# Patient Record
Sex: Female | Born: 1950 | Race: White | Hispanic: No | Marital: Married | Smoking: Former smoker
Health system: Southern US, Community
[De-identification: ages and names within clinical notes are randomized; demographics above are authoritative.]

## PROBLEM LIST (undated history)

## (undated) DIAGNOSIS — D242 Benign neoplasm of left breast: Secondary | ICD-10-CM

## (undated) HISTORY — PX: JOINT REPLACEMENT: SHX530

## (undated) HISTORY — PX: ABDOMINAL HYSTERECTOMY: SHX81

## (undated) HISTORY — PX: OVARIAN CYST SURGERY: SHX726

---

## 1999-11-13 ENCOUNTER — Encounter: Payer: Self-pay | Admitting: Orthopedic Surgery

## 1999-11-17 ENCOUNTER — Encounter: Payer: Self-pay | Admitting: Orthopedic Surgery

## 1999-11-17 ENCOUNTER — Inpatient Hospital Stay (HOSPITAL_COMMUNITY): Admission: RE | Admit: 1999-11-17 | Discharge: 1999-11-21 | Payer: Self-pay | Admitting: Orthopedic Surgery

## 2019-10-18 ENCOUNTER — Other Ambulatory Visit: Payer: Self-pay | Admitting: Family Medicine

## 2019-10-18 DIAGNOSIS — N6489 Other specified disorders of breast: Secondary | ICD-10-CM

## 2019-10-25 ENCOUNTER — Other Ambulatory Visit: Payer: Self-pay

## 2019-10-25 ENCOUNTER — Ambulatory Visit
Admission: RE | Admit: 2019-10-25 | Discharge: 2019-10-25 | Disposition: A | Discharge status: Home / Self Care | Payer: Medicare Other | Source: Ambulatory Visit | Attending: Family Medicine | Admitting: Family Medicine

## 2019-10-25 DIAGNOSIS — N6489 Other specified disorders of breast: Secondary | ICD-10-CM

## 2019-11-14 ENCOUNTER — Ambulatory Visit: Payer: Self-pay | Admitting: Surgery

## 2019-11-14 DIAGNOSIS — D242 Benign neoplasm of left breast: Secondary | ICD-10-CM

## 2019-11-14 NOTE — H&P (Signed)
Terry Arnold Appointment: 11/14/2019 1:50 PM Location: [facility] Patient #: [id number] DOB: 1950-06-10 Married / Language: Terry Arnold / Race: White Female  History of Present Illness Terry Arnold A. Terry Cornacchia MD; 11/14/2019 2:17 PM) Patient words: Patient said for evaluation of abnormal screening mammogram. She underwent a usual Arnold mammogram followed by diagnostic mammogram due to abnormality noted left breast at 12:00 in Terry upper outer quadrant. 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 She presents for discussion of left breast lumpectomy. She has no other complaints. No history of breast pain, nipple discharge or breast mass. no Family history of breast cancer noted.       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 This was found to be concordant by Dr. Dorise Arnold, with excision recommended.  Pathology results were discussed with Terry patient by telephone. Terry patient reported doing well after Terry biopsy with tenderness at Terry site. Post biopsy instructions and care were reviewed and questions were answered. Terry patient was encouraged to call Terry Arnold for any additional concerns.  Surgical consultation has been arranged with Dr. Erroll Arnold at [facility] on November 13, 2019.  Pathology results reported by Terry Acres RN on 10/26/2019.   Electronically Signed By: Terry Arnold On: 10/26/2019 13:52                Diagnosis Breast, left, needle core biopsy, 12 o'clock - SMALL DUCTAL PAPILLOMA. 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 - FIBROCYSTIC CHANGES. - SEE MICROSCOPIC DESCRIPTION. Microscopic Comment Called to Terry Arnold on 10/26/19.  (JDP:gt, 10/26/19) Terry Arnold, Electronic Signature.  Terry patient is a 69 year old female.   Past Surgical History (Terry Arnold, Terry Arnold; 11/14/2019 1:41 PM) Breast Biopsy Left. Hip Arnold Left. Hysterectomy (not due to cancer) - Complete  Diagnostic Studies History (Terry Arnold; 11/14/2019 1:41 PM) Mammogram within last year Pap Smear >5 years ago  Allergies (Terry Arnold, CMA; 11/14/2019 1:42 PM) No Known Drug Allergies [11/14/2019]: Allergies Reconciled  Medication History (Terry Arnold, [place]; 11/14/2019 1:42 PM) No Current Medications Medications Reconciled  Social History Terry Arnold, CMA; 11/14/2019 1:41 PM) Alcohol use Occasional alcohol use. Caffeine use Coffee. Tobacco use Former smoker.  Family History (Terry Arnold, Terry Arnold; 11/14/2019 1:41 PM) Diabetes Mellitus Brother. Respiratory Condition Sister.  Pregnancy / Birth History Terry Arnold, [place]; 11/14/2019 1:41 PM) Age at menarche 11 years. Age of menopause 48-50 Contraceptive History Oral contraceptives. Gravida 0 Para 0  Other Problems (Terry Arnold, [place]; 11/14/2019 1:41 PM) Oophorectomy     Review of Systems (Terry Nolan CMA; 11/14/2019 1:41 PM) Breast Not Present- Breast Mass, Breast Pain, Nipple Discharge and Skin Changes. Cardiovascular Not Present- Chest Pain, Difficulty Breathing Lying Down, Leg Cramps, Palpitations, Rapid Heart Rate, Shortness of Breath and Swelling of Extremities. Gastrointestinal Not Present- Abdominal Pain, Bloating, Bloody Stool, Change in Bowel Habits, Chronic diarrhea, Constipation, Difficulty Swallowing, Excessive gas, Gets full quickly at meals, Hemorrhoids, Indigestion, Nausea, Rectal Pain and Vomiting. Female Genitourinary Not Present- Frequency, Nocturia, Painful Urination, Pelvic Pain and Urgency. Musculoskeletal Not Present- Back Pain, Joint Pain, Joint Stiffness, Muscle Pain, Muscle Weakness and Swelling of Extremities. Neurological  Not Present- Decreased Memory, Fainting, Headaches, Numbness, Seizures, Tingling, Tremor, Trouble walking and Weakness. Psychiatric Not Present- Anxiety, Bipolar, Change in Sleep Pattern, Depression, Fearful and Frequent  crying. Endocrine Not Present- Cold Intolerance, Excessive Hunger, Hair Changes, Heat Intolerance, Hot flashes and New Diabetes. Hematology Not Present- Blood Thinners, Easy Bruising, Excessive bleeding, Gland problems, HIV and Persistent Infections.  Vitals (Terry Nolan CMA; 11/14/2019 1:42 PM) 11/14/2019 1:42 PM Weight: 152.13 lb Height: 66in Body Surface Area: 1.78 m Body Mass Index: 24.55 kg/m  Temp.: 98.9F  Pulse: 88 (Regular)         Physical Exam (Terry Deetz A. Terry Bartus MD; 11/14/2019 2:17 PM)  General Mental Status-Alert. General Appearance-Consistent with stated age. Hydration-Well hydrated. Voice-Normal.  Head and Neck Head-normocephalic, atraumatic with no lesions or palpable masses. Trachea-midline. Thyroid Gland Characteristics - normal size and consistency.  Chest and Lung Exam Chest and lung exam reveals -quiet, even and easy respiratory effort with no use of accessory muscles and on auscultation, normal breath sounds, no adventitious sounds and normal vocal resonance. Inspection Chest Wall - Normal. Back - normal.  Breast Breast - Left-Symmetric, Non Tender, No Biopsy scars, no Dimpling - Left, No Inflammation, No Lumpectomy scars, No Mastectomy scars, No Peau d' Orange. Breast - Right-Symmetric, Non Tender, No Biopsy scars, no Dimpling - Right, No Inflammation, No Lumpectomy scars, No Mastectomy scars, No Peau d' Orange. Breast Lump-No Palpable Breast Mass.  Cardiovascular Cardiovascular examination reveals -normal heart sounds, regular rate and rhythm with no murmurs and normal pedal pulses bilaterally.  Neurologic Neurologic evaluation reveals -alert and oriented x 3 with no impairment of recent or remote  memory. Mental Status-Normal.  Lymphatic Head & Neck  General Head & Neck Lymphatics: Bilateral - Description - Normal. Axillary  General Axillary Region: Bilateral - Description - Normal. Tenderness - Non Tender.    Assessment & Plan (Elizabethanne Lusher A. Bernon Arviso MD; 11/14/2019 2:18 PM)  PAPILLOMA OF BREAST (D24.9) Impression: Discussed pros and cons of lumpectomy. Recommend left breast lumpectomy with seed localization for possible retrograde risk. Discussed observation. Also discussed Terry treatment of complex sclerosing lesion as well as Terry potential risk of upgrade to a more malignant diagnosis. Risk of lumpectomy include bleeding, infection, seroma, more Arnold, use of seed/wire, wound care, cosmetic deformity and Terry need for other treatments, death , blood clots, death. Pt agrees to proceed.  total time 45 minutes   PAPILLOMA OF LEFT BREAST (D24.2)  Current Plans You are being scheduled for Arnold- Our schedulers will call you.  You should hear from our office's scheduling department within 5 working days about Terry location, date, and time of Arnold. We try to make accommodations for patient's preferences in scheduling Arnold, but sometimes Terry OR schedule or Terry surgeon's schedule prevents [place] from making those accommodations.  If you have not heard from our office [phone]) in 5 working days, call Terry office and ask for your surgeon's nurse.  If you have other questions about your diagnosis, plan, or Arnold, call Terry office and ask for your surgeon's nurse.  Pt Education - [facility] Breast Pains Education Pt Education - [facility] Breast Biopsy HCI: discussed with patient and provided information.

## 2019-11-14 NOTE — H&P (View-Only) (Signed)
Terry Arnold Appointment: 11/14/2019 1:50 PM Location: [facility] Patient #: [id number] DOB: [birth year] Married / Language: Cleophus Molt / Race: White Female  History of Present Illness Marcello Moores A. Tailynn Armetta MD; 11/14/2019 2:17 PM) Patient words: Patient said for evaluation of abnormal screening mammogram. She underwent a usual surgery mammogram followed by diagnostic mammogram due to abnormality noted left breast at 12:00 in the upper outer quadrant. 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 She presents for discussion of left breast lumpectomy. She has no other complaints. No history of breast pain, nipple discharge or breast mass. no Family history of breast cancer noted.       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 This was found to be concordant by Dr. Dorise Bullion, with excision recommended.  Pathology results were discussed with the patient by telephone. The patient reported doing well after the biopsy with tenderness at the site. Post biopsy instructions and care were reviewed and questions were answered. The patient was encouraged to call [facility] for any additional concerns.  Surgical consultation has been arranged with Dr. Erroll Luna at [facility] on November 13, 2019.  Pathology results reported by Stacie Acres RN on 10/26/2019.   Electronically Signed By: Dorise Bullion III M.D On: 10/26/2019 13:52                Diagnosis Breast, left, needle core biopsy, 12 o'clock - SMALL DUCTAL PAPILLOMA. 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 - FIBROCYSTIC CHANGES. - SEE MICROSCOPIC DESCRIPTION. Microscopic Comment Called to [facility] on 10/26/19.  (JDP:gt, 10/26/19) Claudette Laws MD Pathologist, Electronic Signature.  The patient is a 69 year old female.   Past Surgical History (Chanel Teressa Senter, [place]; 11/14/2019 1:41 PM) Breast Biopsy Left. Hip Surgery Left. Hysterectomy (not due to cancer) - Complete  Diagnostic Studies History (Chanel Teressa Senter, [place]; 11/14/2019 1:41 PM) Mammogram within last year Pap Smear >5 years ago  Allergies (Chanel Teressa Senter, CMA; 11/14/2019 1:42 PM) No Known Drug Allergies [11/14/2019]: Allergies Reconciled  Medication History (Chanel Teressa Senter, [place]; 11/14/2019 1:42 PM) No Current Medications Medications Reconciled  Social History Antonietta Jewel, CMA; 11/14/2019 1:41 PM) Alcohol use Occasional alcohol use. Caffeine use Coffee. Tobacco use Former smoker.  Family History ([place], [place]; 11/14/2019 1:41 PM) Diabetes Mellitus Brother. Respiratory Condition Sister.  Pregnancy / Birth History Antonietta Jewel, [place]; 11/14/2019 1:41 PM) Age at menarche 59 years. Age of menopause 49-50 Contraceptive History Oral contraceptives. Gravida 0 Para 0  Other Problems (Chanel Teressa Senter, [place]; 11/14/2019 1:41 PM) Oophorectomy     Review of Systems (Chanel Nolan CMA; 11/14/2019 1:41 PM) Breast Not Present- Breast Mass, Breast Pain, Nipple Discharge and Skin Changes. Cardiovascular Not Present- Chest Pain, Difficulty Breathing Lying Down, Leg Cramps, Palpitations, Rapid Heart Rate, Shortness of Breath and Swelling of Extremities. Gastrointestinal Not Present- Abdominal Pain, Bloating, Bloody Stool, Change in Bowel Habits, Chronic diarrhea, Constipation, Difficulty Swallowing, Excessive gas, Gets full quickly at meals, Hemorrhoids, Indigestion, Nausea, Rectal Pain and Vomiting. Female Genitourinary Not Present- Frequency, Nocturia, Painful Urination, Pelvic Pain and Urgency. Musculoskeletal Not Present- Back Pain, Joint Pain, Joint Stiffness, Muscle Pain, Muscle Weakness and Swelling of Extremities. Neurological  Not Present- Decreased Memory, Fainting, Headaches, Numbness, Seizures, Tingling, Tremor, Trouble walking and Weakness. Psychiatric Not Present- Anxiety, Bipolar, Change in Sleep Pattern, Depression, Fearful and Frequent  crying. Endocrine Not Present- Cold Intolerance, Excessive Hunger, Hair Changes, Heat Intolerance, Hot flashes and New Diabetes. Hematology Not Present- Blood Thinners, Easy Bruising, Excessive bleeding, Gland problems, HIV and Persistent Infections.  Vitals (Chanel Nolan CMA; 11/14/2019 1:42 PM) 11/14/2019 1:42 PM Weight: 152.13 lb Height: 66in Body Surface Area: 1.78 m Body Mass Index: 24.55 kg/m  Temp.: 98.23F  Pulse: 88 (Regular)         Physical Exam (Reznor Ferrando A. Rosey Eide MD; 11/14/2019 2:17 PM)  General Mental Status-Alert. General Appearance-Consistent with stated age. Hydration-Well hydrated. Voice-Normal.  Head and Neck Head-normocephalic, atraumatic with no lesions or palpable masses. Trachea-midline. Thyroid Gland Characteristics - normal size and consistency.  Chest and Lung Exam Chest and lung exam reveals -quiet, even and easy respiratory effort with no use of accessory muscles and on auscultation, normal breath sounds, no adventitious sounds and normal vocal resonance. Inspection Chest Wall - Normal. Back - normal.  Breast Breast - Left-Symmetric, Non Tender, No Biopsy scars, no Dimpling - Left, No Inflammation, No Lumpectomy scars, No Mastectomy scars, No Peau d' Orange. Breast - Right-Symmetric, Non Tender, No Biopsy scars, no Dimpling - Right, No Inflammation, No Lumpectomy scars, No Mastectomy scars, No Peau d' Orange. Breast Lump-No Palpable Breast Mass.  Cardiovascular Cardiovascular examination reveals -normal heart sounds, regular rate and rhythm with no murmurs and normal pedal pulses bilaterally.  Neurologic Neurologic evaluation reveals -alert and oriented x 3 with no impairment of recent or remote  memory. Mental Status-Normal.  Lymphatic Head & Neck  General Head & Neck Lymphatics: Bilateral - Description - Normal. Axillary  General Axillary Region: Bilateral - Description - Normal. Tenderness - Non Tender.    Assessment & Plan (Chole Driver A. Ceana Fiala MD; 11/14/2019 2:18 PM)  PAPILLOMA OF BREAST (D24.9) Impression: Discussed pros and cons of lumpectomy. Recommend left breast lumpectomy with seed localization for possible retrograde risk. Discussed observation. Also discussed the treatment of complex sclerosing lesion as well as the potential risk of upgrade to a more malignant diagnosis. Risk of lumpectomy include bleeding, infection, seroma, more surgery, use of seed/wire, wound care, cosmetic deformity and the need for other treatments, death , blood clots, death. Pt agrees to proceed.  total time 45 minutes   PAPILLOMA OF LEFT BREAST (D24.2)  Current Plans You are being scheduled for surgery- Our schedulers will call you.  You should hear from our office's scheduling department within 5 working days about the location, date, and time of surgery. We try to make accommodations for patient's preferences in scheduling surgery, but sometimes the OR schedule or the surgeon's schedule prevents [place] from making those accommodations.  If you have not heard from our office [phone]) in 5 working days, call the office and ask for your surgeon's nurse.  If you have other questions about your diagnosis, plan, or surgery, call the office and ask for your surgeon's nurse.  Pt Education - [facility] Breast Pains Education Pt Education - [facility] Breast Biopsy HCI: discussed with patient and provided information.

## 2019-11-21 ENCOUNTER — Other Ambulatory Visit: Payer: Self-pay | Admitting: Surgery

## 2019-11-21 DIAGNOSIS — D242 Benign neoplasm of left breast: Secondary | ICD-10-CM

## 2019-11-29 ENCOUNTER — Other Ambulatory Visit: Payer: Self-pay

## 2019-11-29 ENCOUNTER — Encounter (HOSPITAL_BASED_OUTPATIENT_CLINIC_OR_DEPARTMENT_OTHER): Payer: Self-pay | Admitting: Surgery

## 2019-12-04 ENCOUNTER — Encounter (HOSPITAL_BASED_OUTPATIENT_CLINIC_OR_DEPARTMENT_OTHER)
Admission: RE | Admit: 2019-12-04 | Discharge: 2019-12-04 | Disposition: A | Discharge status: Home / Self Care | Payer: Medicare Other | Source: Ambulatory Visit | Attending: Surgery | Admitting: Surgery

## 2019-12-04 ENCOUNTER — Other Ambulatory Visit (HOSPITAL_COMMUNITY)
Admission: RE | Admit: 2019-12-04 | Discharge: 2019-12-04 | Disposition: A | Discharge status: Home / Self Care | Payer: Medicare Other | Source: Ambulatory Visit | Attending: Surgery | Admitting: Surgery

## 2019-12-04 DIAGNOSIS — Z01812 Encounter for preprocedural laboratory examination: Secondary | ICD-10-CM | POA: Insufficient documentation

## 2019-12-04 DIAGNOSIS — Z20822 Contact with and (suspected) exposure to covid-19: Secondary | ICD-10-CM | POA: Insufficient documentation

## 2019-12-04 LAB — CBC WITH DIFFERENTIAL/PLATELET
Abs Immature Granulocytes: 0.02 10*3/uL (ref 0.00–0.07)
Basophils Absolute: 0 10*3/uL (ref 0.0–0.1)
Basophils Relative: 1 %
Eosinophils Absolute: 0.1 10*3/uL (ref 0.0–0.5)
Eosinophils Relative: 3 %
HCT: 39.2 % (ref 36.0–46.0)
Hemoglobin: 13 g/dL (ref 12.0–15.0)
Immature Granulocytes: 0 %
Lymphocytes Relative: 31 %
Lymphs Abs: 1.4 10*3/uL (ref 0.7–4.0)
MCH: 30.2 pg (ref 26.0–34.0)
MCHC: 33.2 g/dL (ref 30.0–36.0)
MCV: 91 fL (ref 80.0–100.0)
Monocytes Absolute: 0.3 10*3/uL (ref 0.1–1.0)
Monocytes Relative: 7 %
Neutro Abs: 2.7 10*3/uL (ref 1.7–7.7)
Neutrophils Relative %: 58 %
Platelets: 200 10*3/uL (ref 150–400)
RBC: 4.31 MIL/uL (ref 3.87–5.11)
RDW: 12.5 % (ref 11.5–15.5)
WBC: 4.6 10*3/uL (ref 4.0–10.5)
nRBC: 0 % (ref 0.0–0.2)

## 2019-12-04 LAB — COMPREHENSIVE METABOLIC PANEL
ALT: 16 U/L (ref 0–44)
AST: 19 U/L (ref 15–41)
Albumin: 3.7 g/dL (ref 3.5–5.0)
Alkaline Phosphatase: 60 U/L (ref 38–126)
Anion gap: 7 (ref 5–15)
BUN: 5 mg/dL — ABNORMAL LOW (ref 8–23)
CO2: 28 mmol/L (ref 22–32)
Calcium: 9.1 mg/dL (ref 8.9–10.3)
Chloride: 101 mmol/L (ref 98–111)
Creatinine, Ser: 0.59 mg/dL (ref 0.44–1.00)
GFR calc Af Amer: >60 mL/min (ref >60)
GFR calc non Af Amer: >60 mL/min (ref >60)
Glucose, Bld: 110 mg/dL — ABNORMAL HIGH (ref 70–99)
Potassium: 4.3 mmol/L (ref 3.5–5.1)
Sodium: 136 mmol/L (ref 135–145)
Total Bilirubin: 0.8 mg/dL (ref 0.3–1.2)
Total Protein: 6.2 g/dL — ABNORMAL LOW (ref 6.5–8.1)

## 2019-12-04 LAB — SARS CORONAVIRUS 2 (TAT 6-24 HRS): SARS Coronavirus 2: NEGATIVE

## 2019-12-04 NOTE — Progress Notes (Signed)

## 2019-12-06 ENCOUNTER — Other Ambulatory Visit: Payer: Self-pay

## 2019-12-06 ENCOUNTER — Ambulatory Visit
Admission: RE | Admit: 2019-12-06 | Discharge: 2019-12-06 | Disposition: A | Discharge status: Home / Self Care | Payer: Medicare Other | Source: Ambulatory Visit | Attending: Surgery | Admitting: Surgery

## 2019-12-06 DIAGNOSIS — D242 Benign neoplasm of left breast: Secondary | ICD-10-CM

## 2019-12-07 ENCOUNTER — Encounter (HOSPITAL_BASED_OUTPATIENT_CLINIC_OR_DEPARTMENT_OTHER)
Admission: RE | Disposition: A | Discharge status: Home / Self Care | Payer: Self-pay | Source: Home / Self Care | Attending: Surgery

## 2019-12-07 ENCOUNTER — Ambulatory Visit
Admission: RE | Admit: 2019-12-07 | Discharge: 2019-12-07 | Disposition: A | Discharge status: Home / Self Care | Payer: Medicare Other | Source: Ambulatory Visit | Attending: Surgery | Admitting: Surgery

## 2019-12-07 ENCOUNTER — Ambulatory Visit (HOSPITAL_BASED_OUTPATIENT_CLINIC_OR_DEPARTMENT_OTHER)
Admission: RE | Admit: 2019-12-07 | Discharge: 2019-12-07 | Disposition: A | Discharge status: Home / Self Care | Payer: Medicare Other | Attending: Surgery | Admitting: Surgery

## 2019-12-07 ENCOUNTER — Ambulatory Visit (HOSPITAL_BASED_OUTPATIENT_CLINIC_OR_DEPARTMENT_OTHER): Payer: Medicare Other | Admitting: Anesthesiology

## 2019-12-07 ENCOUNTER — Encounter (HOSPITAL_BASED_OUTPATIENT_CLINIC_OR_DEPARTMENT_OTHER): Payer: Self-pay | Admitting: Surgery

## 2019-12-07 ENCOUNTER — Other Ambulatory Visit: Payer: Self-pay

## 2019-12-07 DIAGNOSIS — N6489 Other specified disorders of breast: Secondary | ICD-10-CM | POA: Diagnosis not present

## 2019-12-07 DIAGNOSIS — D242 Benign neoplasm of left breast: Secondary | ICD-10-CM | POA: Insufficient documentation

## 2019-12-07 DIAGNOSIS — N6022 Fibroadenosis of left breast: Secondary | ICD-10-CM | POA: Insufficient documentation

## 2019-12-07 DIAGNOSIS — Z87891 Personal history of nicotine dependence: Secondary | ICD-10-CM | POA: Diagnosis not present

## 2019-12-07 HISTORY — DX: Benign neoplasm of left breast: D24.2

## 2019-12-07 HISTORY — PX: BREAST LUMPECTOMY WITH RADIOACTIVE SEED LOCALIZATION: SHX6424

## 2019-12-07 SURGERY — BREAST LUMPECTOMY WITH RADIOACTIVE SEED LOCALIZATION
Anesthesia: General | Site: Breast | Laterality: Left

## 2019-12-07 MED ORDER — ACETAMINOPHEN 325 MG PO TABS: 325.0000 mg | ORAL_TABLET | ORAL | Status: DC | PRN

## 2019-12-07 MED ORDER — GABAPENTIN 300 MG PO CAPS
ORAL_CAPSULE | ORAL | Status: AC
  Filled 2019-12-07: qty 1

## 2019-12-07 MED ORDER — ONDANSETRON HCL 4 MG/2ML IJ SOLN
INTRAMUSCULAR | Status: DC | PRN
  Administered 2019-12-07: 4 mg via INTRAVENOUS

## 2019-12-07 MED ORDER — PROPOFOL 10 MG/ML IV BOLUS
INTRAVENOUS | Status: DC | PRN
  Administered 2019-12-07: 150 mg via INTRAVENOUS
  Administered 2019-12-07: 30 mg via INTRAVENOUS

## 2019-12-07 MED ORDER — OXYCODONE HCL 5 MG PO TABS: 5.0000 mg | ORAL_TABLET | Freq: Once | ORAL | Status: DC | PRN

## 2019-12-07 MED ORDER — CEFAZOLIN SODIUM-DEXTROSE 2-4 GM/100ML-% IV SOLN
2.0000 g | INTRAVENOUS | Status: AC
  Administered 2019-12-07: 2 g via INTRAVENOUS

## 2019-12-07 MED ORDER — ACETAMINOPHEN 500 MG PO TABS
1000.0000 mg | ORAL_TABLET | ORAL | Status: AC
  Administered 2019-12-07: 1000 mg via ORAL

## 2019-12-07 MED ORDER — ROCURONIUM BROMIDE 10 MG/ML (PF) SYRINGE
PREFILLED_SYRINGE | INTRAVENOUS | Status: AC
  Filled 2019-12-07: qty 10

## 2019-12-07 MED ORDER — DEXAMETHASONE SODIUM PHOSPHATE 10 MG/ML IJ SOLN
INTRAMUSCULAR | Status: DC | PRN
  Administered 2019-12-07: 5 mg via INTRAVENOUS

## 2019-12-07 MED ORDER — ACETAMINOPHEN 160 MG/5ML PO SOLN: 325.0000 mg | ORAL | Status: DC | PRN

## 2019-12-07 MED ORDER — LIDOCAINE HCL (CARDIAC) PF 100 MG/5ML IV SOSY
PREFILLED_SYRINGE | INTRAVENOUS | Status: DC | PRN
  Administered 2019-12-07: 100 mg via INTRAVENOUS

## 2019-12-07 MED ORDER — CHLORHEXIDINE GLUCONATE CLOTH 2 % EX PADS: 6.0000 | MEDICATED_PAD | Freq: Once | CUTANEOUS | Status: DC

## 2019-12-07 MED ORDER — FENTANYL CITRATE (PF) 100 MCG/2ML IJ SOLN
INTRAMUSCULAR | Status: AC
  Filled 2019-12-07: qty 2

## 2019-12-07 MED ORDER — HYDROCODONE-ACETAMINOPHEN 5-325 MG PO TABS: 1.0000 | ORAL_TABLET | Freq: Four times a day (QID) | ORAL | 0 refills | Status: AC | PRN

## 2019-12-07 MED ORDER — PROPOFOL 10 MG/ML IV BOLUS
INTRAVENOUS | Status: AC
  Filled 2019-12-07: qty 20

## 2019-12-07 MED ORDER — LACTATED RINGERS IV SOLN: INTRAVENOUS | Status: DC

## 2019-12-07 MED ORDER — ONDANSETRON HCL 4 MG/2ML IJ SOLN: 4.0000 mg | Freq: Once | INTRAMUSCULAR | Status: DC | PRN

## 2019-12-07 MED ORDER — ACETAMINOPHEN 500 MG PO TABS
ORAL_TABLET | ORAL | Status: AC
  Filled 2019-12-07: qty 2

## 2019-12-07 MED ORDER — BUPIVACAINE HCL (PF) 0.25 % IJ SOLN
INTRAMUSCULAR | Status: DC | PRN
  Administered 2019-12-07: 7 mL

## 2019-12-07 MED ORDER — IBUPROFEN 800 MG PO TABS: 800.0000 mg | ORAL_TABLET | Freq: Three times a day (TID) | ORAL | 0 refills | Status: AC | PRN

## 2019-12-07 MED ORDER — OXYCODONE HCL 5 MG/5ML PO SOLN: 5.0000 mg | Freq: Once | ORAL | Status: DC | PRN

## 2019-12-07 MED ORDER — MEPERIDINE HCL 25 MG/ML IJ SOLN: 6.2500 mg | INTRAMUSCULAR | Status: DC | PRN

## 2019-12-07 MED ORDER — FENTANYL CITRATE (PF) 100 MCG/2ML IJ SOLN
INTRAMUSCULAR | Status: DC | PRN
  Administered 2019-12-07: 25 ug via INTRAVENOUS

## 2019-12-07 MED ORDER — GABAPENTIN 300 MG PO CAPS
300.0000 mg | ORAL_CAPSULE | ORAL | Status: AC
  Administered 2019-12-07: 300 mg via ORAL

## 2019-12-07 MED ORDER — CEFAZOLIN SODIUM-DEXTROSE 2-4 GM/100ML-% IV SOLN
INTRAVENOUS | Status: AC
  Filled 2019-12-07: qty 100

## 2019-12-07 MED ORDER — EPHEDRINE SULFATE 50 MG/ML IJ SOLN
INTRAMUSCULAR | Status: DC | PRN
  Administered 2019-12-07: 10 mg via INTRAVENOUS
  Administered 2019-12-07: 15 mg via INTRAVENOUS

## 2019-12-07 MED ORDER — FENTANYL CITRATE (PF) 100 MCG/2ML IJ SOLN: 25.0000 ug | INTRAMUSCULAR | Status: DC | PRN

## 2019-12-07 SURGICAL SUPPLY — 53 items
ADH SKN CLS APL DERMABOND .7 (GAUZE/BANDAGES/DRESSINGS) ×1
APL PRP STRL LF DISP 70% ISPRP (MISCELLANEOUS) ×1
APPLIER CLIP 9.375 MED OPEN (MISCELLANEOUS)
APR CLP MED 9.3 20 MLT OPN (MISCELLANEOUS)
BINDER BREAST LRG (GAUZE/BANDAGES/DRESSINGS) IMPLANT
BINDER BREAST MEDIUM (GAUZE/BANDAGES/DRESSINGS) IMPLANT
BINDER BREAST XLRG (GAUZE/BANDAGES/DRESSINGS) ×2 IMPLANT
BINDER BREAST XXLRG (GAUZE/BANDAGES/DRESSINGS) IMPLANT
BLADE SURG 15 STRL LF DISP TIS (BLADE) ×1 IMPLANT
BLADE SURG 15 STRL SS (BLADE) ×3
CANISTER SUC SOCK COL 7IN (MISCELLANEOUS) IMPLANT
CANISTER SUCT 1200ML W/VALVE (MISCELLANEOUS) IMPLANT
CHLORAPREP W/TINT 26 (MISCELLANEOUS) ×3 IMPLANT
CLIP APPLIE 9.375 MED OPEN (MISCELLANEOUS) IMPLANT
COVER BACK TABLE 60X90IN (DRAPES) ×3 IMPLANT
COVER MAYO STAND STRL (DRAPES) ×3 IMPLANT
COVER PROBE W GEL 5X96 (DRAPES) ×3 IMPLANT
COVER WAND RF STERILE (DRAPES) IMPLANT
DECANTER SPIKE VIAL GLASS SM (MISCELLANEOUS) IMPLANT
DERMABOND ADVANCED (GAUZE/BANDAGES/DRESSINGS) ×2
DERMABOND ADVANCED .7 DNX12 (GAUZE/BANDAGES/DRESSINGS) ×1 IMPLANT
DRAPE LAPAROSCOPIC ABDOMINAL (DRAPES) IMPLANT
DRAPE LAPAROTOMY 100X72 PEDS (DRAPES) ×3 IMPLANT
DRAPE UTILITY XL STRL (DRAPES) ×3 IMPLANT
ELECT COATED BLADE 2.86 ST (ELECTRODE) ×3 IMPLANT
ELECT REM PT RETURN 9FT ADLT (ELECTROSURGICAL) ×3
ELECTRODE REM PT RTRN 9FT ADLT (ELECTROSURGICAL) ×1 IMPLANT
GLOVE BIOGEL PI IND STRL 7.0 (GLOVE) IMPLANT
GLOVE BIOGEL PI IND STRL 8 (GLOVE) ×1 IMPLANT
GLOVE BIOGEL PI INDICATOR 7.0 (GLOVE) ×2
GLOVE BIOGEL PI INDICATOR 8 (GLOVE) ×2
GLOVE ECLIPSE 8.0 STRL XLNG CF (GLOVE) ×3 IMPLANT
GOWN STRL REUS W/ TWL LRG LVL3 (GOWN DISPOSABLE) ×2 IMPLANT
GOWN STRL REUS W/TWL LRG LVL3 (GOWN DISPOSABLE) ×6
HEMOSTAT ARISTA ABSORB 3G PWDR (HEMOSTASIS) IMPLANT
HEMOSTAT SNOW SURGICEL 2X4 (HEMOSTASIS) IMPLANT
KIT MARKER MARGIN INK (KITS) ×3 IMPLANT
NDL HYPO 25X1 1.5 SAFETY (NEEDLE) ×1 IMPLANT
NEEDLE HYPO 25X1 1.5 SAFETY (NEEDLE) ×3 IMPLANT
NS IRRIG 1000ML POUR BTL (IV SOLUTION) ×1 IMPLANT
PACK BASIN DAY SURGERY FS (CUSTOM PROCEDURE TRAY) ×3 IMPLANT
PENCIL SMOKE EVACUATOR (MISCELLANEOUS) ×3 IMPLANT
SLEEVE SCD COMPRESS KNEE MED (MISCELLANEOUS) ×3 IMPLANT
SPONGE LAP 4X18 RFD (DISPOSABLE) ×3 IMPLANT
SUT MNCRL AB 4-0 PS2 18 (SUTURE) ×3 IMPLANT
SUT SILK 2 0 SH (SUTURE) IMPLANT
SUT VICRYL 3-0 CR8 SH (SUTURE) ×3 IMPLANT
SYR CONTROL 10ML LL (SYRINGE) ×3 IMPLANT
TOWEL GREEN STERILE FF (TOWEL DISPOSABLE) ×3 IMPLANT
TRAY FAXITRON CT DISP (TRAY / TRAY PROCEDURE) ×3 IMPLANT
TUBE CONNECTING 20'X1/4 (TUBING)
TUBE CONNECTING 20X1/4 (TUBING) IMPLANT
YANKAUER SUCT BULB TIP NO VENT (SUCTIONS) IMPLANT

## 2019-12-07 NOTE — Transfer of Care (Signed)
Immediate Anesthesia Transfer of Care Note  Patient: Terry Arnold  Procedure(s) Performed: LEFT BREAST LUMPECTOMY WITH RADIOACTIVE SEED LOCALIZATION (Left Breast)  Patient Location: PACU  Anesthesia Type:General  Level of Consciousness: awake, alert  and oriented  Airway & Oxygen Therapy: Patient Spontanous Breathing and Patient connected to face mask oxygen  Post-op Assessment: Report given to RN and Post -op Vital signs reviewed and stable  Post vital signs: Reviewed and stable  Last Vitals:  Vitals Value Taken Time  BP 128/72 12/07/19 1557  Temp    Pulse 90 12/07/19 1600  Resp 15 12/07/19 1600  SpO2 100 % 12/07/19 1600  Vitals shown include unvalidated device data.  Last Pain:  Vitals:   12/07/19 1247  TempSrc: Oral  PainSc: 0-No pain         Complications: No complications documented.

## 2019-12-07 NOTE — Anesthesia Procedure Notes (Signed)
Procedure Name: LMA Insertion Date/Time: 12/07/2019 3:22 PM Performed by: Lavonia Dana, CRNA Pre-anesthesia Checklist: Patient identified, Emergency Drugs available, Suction available and Patient being monitored Patient Re-evaluated:Patient Re-evaluated prior to induction Oxygen Delivery Method: Circle system utilized Preoxygenation: Pre-oxygenation with 100% oxygen Induction Type: IV induction Ventilation: Mask ventilation without difficulty LMA: LMA inserted LMA Size: 4.0 Number of attempts: 1 Airway Equipment and Method: Bite block Placement Confirmation: positive ETCO2 Tube secured with: Tape Dental Injury: Teeth and Oropharynx as per pre-operative assessment

## 2019-12-07 NOTE — Interval H&P Note (Signed)
History and Physical Interval Note:  12/07/2019 3:00 PM  Terry Arnold  has presented today for surgery, with the diagnosis of LEFT BREAST PAPILLOMA.  The various methods of treatment have been discussed with the patient and family. After consideration of risks, benefits and other options for treatment, the patient has consented to  Procedure(s): LEFT BREAST LUMPECTOMY WITH RADIOACTIVE SEED LOCALIZATION (Left) as a surgical intervention.  The patient's history has been reviewed, patient examined, no change in status, stable for surgery.  I have reviewed the patient's chart and labs.  Questions were answered to the patient's satisfaction.     [place]

## 2019-12-07 NOTE — Discharge Instructions (Signed)
[facility] Office Phone Number [phone]  BREAST BIOPSY/ LUMPECTOMY: POST OP INSTRUCTIONS  Always review your discharge instruction sheet given to you by the facility where your surgery was performed.  IF YOU HAVE DISABILITY OR FAMILY LEAVE FORMS, YOU MUST BRING THEM TO THE OFFICE FOR PROCESSING.  DO NOT GIVE THEM TO YOUR DOCTOR.  1. A prescription for pain medication may be given to you upon discharge.  Take your pain medication as prescribed, if needed.  If narcotic pain medicine is not needed, then you may take acetaminophen (Tylenol) or ibuprofen (Advil) as needed. Do not take Tylenol until 7:00 pm if needed. 2. Take your usually prescribed medications unless otherwise directed 3. If you need a refill on your pain medication, please contact your pharmacy.  They will contact our office to request authorization.  Prescriptions will not be filled after 5pm or on week-ends. 4. You should eat very light the first 24 hours after surgery, such as soup, crackers, pudding, etc.  Resume your normal diet the day after surgery. 5. Most patients will experience some swelling and bruising in the breast.  Ice packs and a good support bra will help.  Swelling and bruising can take several days to resolve.  6. It is common to experience some constipation if taking pain medication after surgery.  Increasing fluid intake and taking a stool softener will usually help or prevent this problem from occurring.  A mild laxative (Milk of Magnesia or Miralax) should be taken according to package directions if there are no bowel movements after 48 hours. 7. Unless discharge instructions indicate otherwise, you may remove your bandages 24-48 hours after surgery, and you may shower at that time.  You may have steri-strips (small skin tapes) in place directly over the incision.  These strips should be left on the skin for 7-10 days.  If your surgeon used skin glue on the incision, you may shower in 24 hours.   The glue will flake off over the next 2-3 weeks.  Any sutures or staples will be removed at the office during your follow-up visit. 8. ACTIVITIES:  You may resume regular daily activities (gradually increasing) beginning the next day.  Wearing a good support bra or sports bra minimizes pain and swelling.  You may have sexual intercourse when it is comfortable. a. You may drive when you no longer are taking prescription pain medication, you can comfortably wear a seatbelt, and you can safely maneuver your car and apply brakes. b. RETURN TO WORK:  ______________________________________________________________________________________ 9. You should see your doctor in the office for a follow-up appointment approximately two weeks after your surgery.  Your doctor's nurse will typically make your follow-up appointment when she calls you with your pathology report.  Expect your pathology report 2-3 business days after your surgery.  You may call to check if you do not hear from [place] after three days. 10. OTHER INSTRUCTIONS: _______________________________________________________________________________________________ _____________________________________________________________________________________________________________________________________ _____________________________________________________________________________________________________________________________________ _____________________________________________________________________________________________________________________________________  WHEN TO CALL YOUR DOCTOR: 1. Fever over 101.0 2. Nausea and/or vomiting. 3. Extreme swelling or bruising. 4. Continued bleeding from incision. 5. Increased pain, redness, or drainage from the incision.  The clinic staff is available to answer your questions during regular business hours.  Please don't hesitate to call and ask to speak to one of the nurses for clinical concerns.  If you have a medical  emergency, go to the nearest emergency room or call 911.  A surgeon from [facility] is always on call at the hospital.  For further questions, please visit [email or web address]    Post Anesthesia Home Care Instructions  Activity: Get plenty of rest for the remainder of the day. A responsible individual must stay with you for 24 hours following the procedure.  For the next 24 hours, DO NOT: -Drive a car -Paediatric nurse -Drink alcoholic beverages -Take any medication unless instructed by your physician -Make any legal decisions or sign important papers.  Meals: Start with liquid foods such as gelatin or soup. Progress to regular foods as tolerated. Avoid greasy, spicy, heavy foods. If nausea and/or vomiting occur, drink only clear liquids until the nausea and/or vomiting subsides. Call your physician if vomiting continues.  Special Instructions/Symptoms: Your throat may feel dry or sore from the anesthesia or the breathing tube placed in your throat during surgery. If this causes discomfort, gargle with warm salt water. The discomfort should disappear within 24 hours.  If you had a scopolamine patch placed behind your ear for the management of post- operative nausea and/or vomiting:  1. The medication in the patch is effective for 72 hours, after which it should be removed.  Wrap patch in a tissue and discard in the trash. Wash hands thoroughly with soap and water. 2. You may remove the patch earlier than 72 hours if you experience unpleasant side effects which may include dry mouth, dizziness or visual disturbances. 3. Avoid touching the patch. Wash your hands with soap and water after contact with the patch.

## 2019-12-07 NOTE — Anesthesia Preprocedure Evaluation (Signed)
Anesthesia Evaluation  Patient identified by MRN, date of birth, ID band Patient awake    Reviewed: Allergy & Precautions, NPO status , Patient's Chart, lab work & pertinent test results  Airway Mallampati: I       Dental no notable dental hx. (+) Teeth Intact   Pulmonary former smoker,    Pulmonary exam normal        Cardiovascular negative cardio ROS Normal cardiovascular exam Rhythm:Regular Rate:Normal     Neuro/Psych negative neurological ROS  negative psych ROS   GI/Hepatic negative GI ROS, Neg liver ROS,   Endo/Other  negative endocrine ROS  Renal/GU negative Renal ROS  negative genitourinary   Musculoskeletal negative musculoskeletal ROS (+)   Abdominal Normal abdominal exam  (+)   Peds  Hematology negative hematology ROS (+)   Anesthesia Other Findings   Reproductive/Obstetrics                             Anesthesia Physical Anesthesia Plan  ASA: I  Anesthesia Plan: General   Post-op Pain Management:    Induction: Intravenous  PONV Risk Score and Plan: 4 or greater and Ondansetron and Dexamethasone  Airway Management Planned: LMA  Additional Equipment: None  Intra-op Plan:   Post-operative Plan: Extubation in OR  Informed Consent: I have reviewed the patients History and Physical, chart, labs and discussed the procedure including the risks, benefits and alternatives for the proposed anesthesia with the patient or authorized representative who has indicated his/her understanding and acceptance.     Dental advisory given  Plan Discussed with: CRNA  Anesthesia Plan Comments:         Anesthesia Quick Evaluation

## 2019-12-07 NOTE — Op Note (Signed)
Preoperative diagnosis: Left breast papilloma  Postoperative diagnosis: Same  Procedure: Left breast seed localized lumpectomy  Surgeon: Erroll Luna, MD  Anesthesia: LMA with local consisting of 0.25% Marcaine plain  EBL: Minimal  Specimen: Left breast tissue with seed and clip verified by Faxitron  Drains: None  IV fluids: Per anesthesia record  Indications for procedure: The patient is a 69 year old female with a mammographic abnormality core biopsy-proven to be a papilloma.  We discussed options of observation versus excision and potential upgrade risk to a more potential malignant lesion.  She opted for left breast lumpectomy after discussion of the above.  The procedure has been discussed with the patient. Alternatives to surgery have been discussed with the patient.  Risks of surgery include bleeding,  Infection,  Seroma formation, death,  and the need for further surgery.   The patient understands and wishes to proceed.   Description of procedure: The patient was met in the holding area and questions were answered.  Left breast was marked as correct site and neoprobe used to verify seed functional location.  She was then taken back to the operating.  She is placed supine upon the OR table.  After induction of general esthesia left breast was prepped and draped in sterile fashion and timeout performed.  Neoprobe used to verify seed location left breast upper outer quadrant.  Curvilinear incision was made over the region in the left breast upper outer quadrant.  Dissection was carried down all tissue and the seed and clip were excised with a grossly negative margin.  Faxitron image revealed seed and clip to be in specimen and it was oriented and sent to pathology.  Local anesthetic was infiltrated to the cavity.  The cavities made hemostatic with cautery and then closed with 3-0 Vicryl and 4-0 Monocryl.  Dermabond applied.  All counts found to be correct.  Patient awoke extubated taken  recovery in satisfactory condition.

## 2019-12-07 NOTE — Anesthesia Postprocedure Evaluation (Signed)
Anesthesia Post Note  Patient: Terry Arnold  Procedure(s) Performed: LEFT BREAST LUMPECTOMY WITH RADIOACTIVE SEED LOCALIZATION (Left Breast)     Patient location during evaluation: Phase II Anesthesia Type: General Level of consciousness: awake Pain management: pain level controlled Vital Signs Assessment: post-procedure vital signs reviewed and stable Respiratory status: spontaneous breathing Cardiovascular status: stable Postop Assessment: no apparent nausea or vomiting Anesthetic complications: no   No complications documented.  Last Vitals:  Vitals:   12/07/19 1630 12/07/19 1644  BP: 138/75 (!) 149/70  Pulse: 80 66  Resp: 14 16  Temp: (!) 36.4 C 36.6 C  SpO2: 97% 98%    Last Pain:  Vitals:   12/07/19 1630  TempSrc:   PainSc: 0-No pain                 Huston Foley

## 2019-12-08 ENCOUNTER — Encounter (HOSPITAL_BASED_OUTPATIENT_CLINIC_OR_DEPARTMENT_OTHER): Payer: Self-pay | Admitting: Surgery

## 2019-12-12 LAB — SURGICAL PATHOLOGY

## 2021-01-29 IMAGING — MG MM PLC BREAST LOC DEV 1ST LESION INC MAMMO GUIDE*L*
8 of 12 series · 8 of 20 positions shown · non-contrast
Comparison: 10/25/2019 and earlier
COMPARISON: 10/25/2019 and earlier

Addendum:
CLINICAL DATA: Patient presents for seed localization prior to
lumpectomy for LCIS.

EXAM:
MAMMOGRAPHIC GUIDED RADIOACTIVE SEED LOCALIZATION OF THE LEFT BREAST
lumpectomy for recently diagnosed papilloma and complex sclerosing
lesion in the LEFT breast.
*** End of Addendum ***

[L LM (1 of 4)]
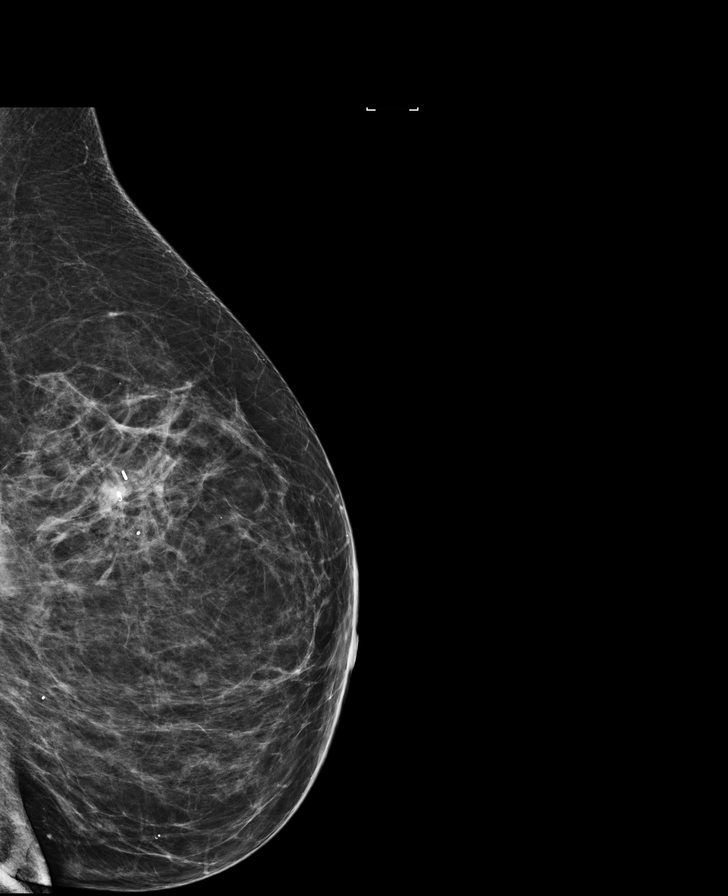

[L CC (1 of 4)]
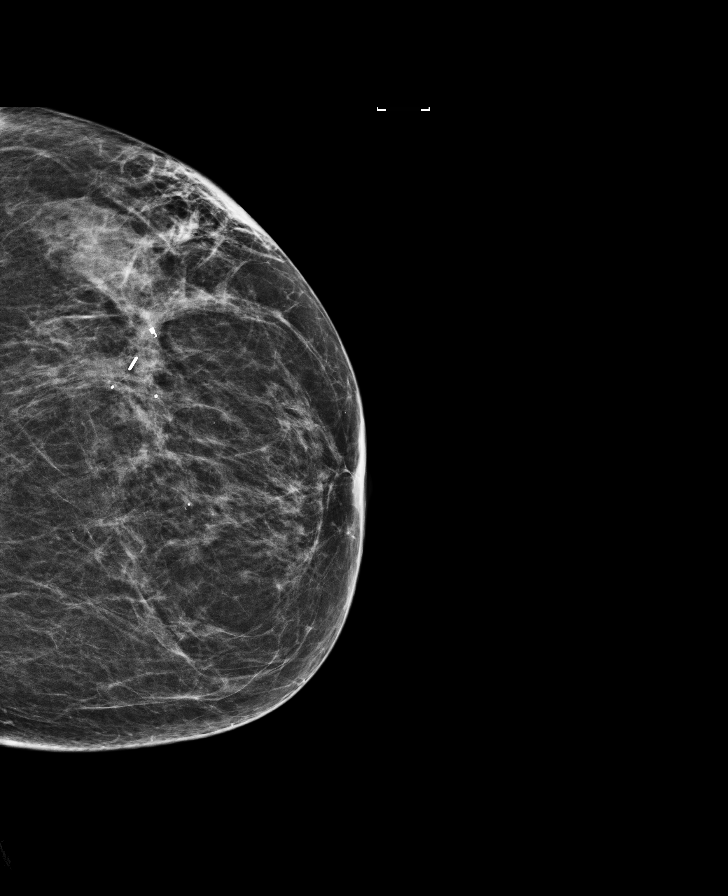

[L LM (2 of 4)]
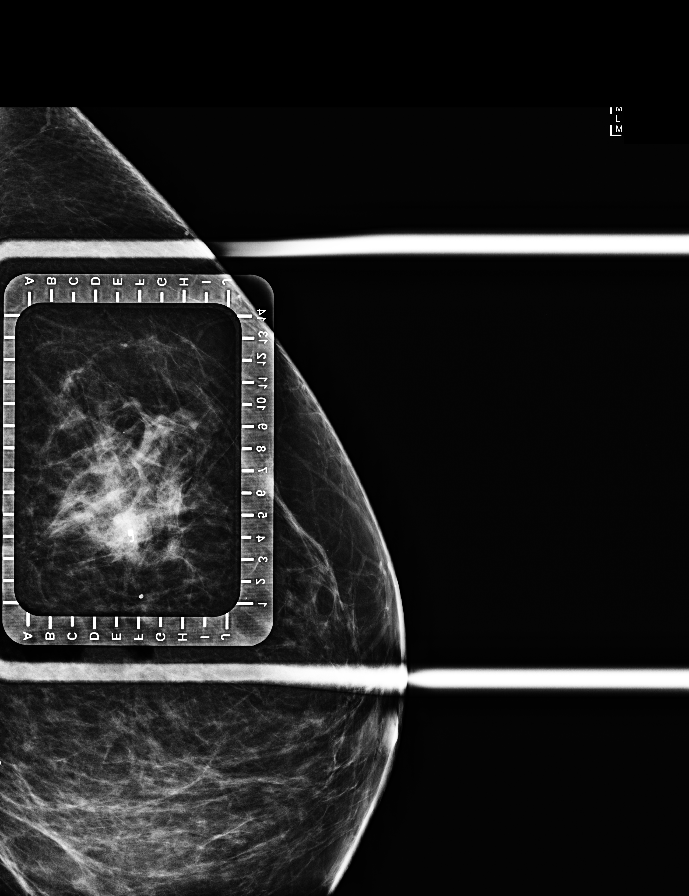

[L CC (2 of 4)]
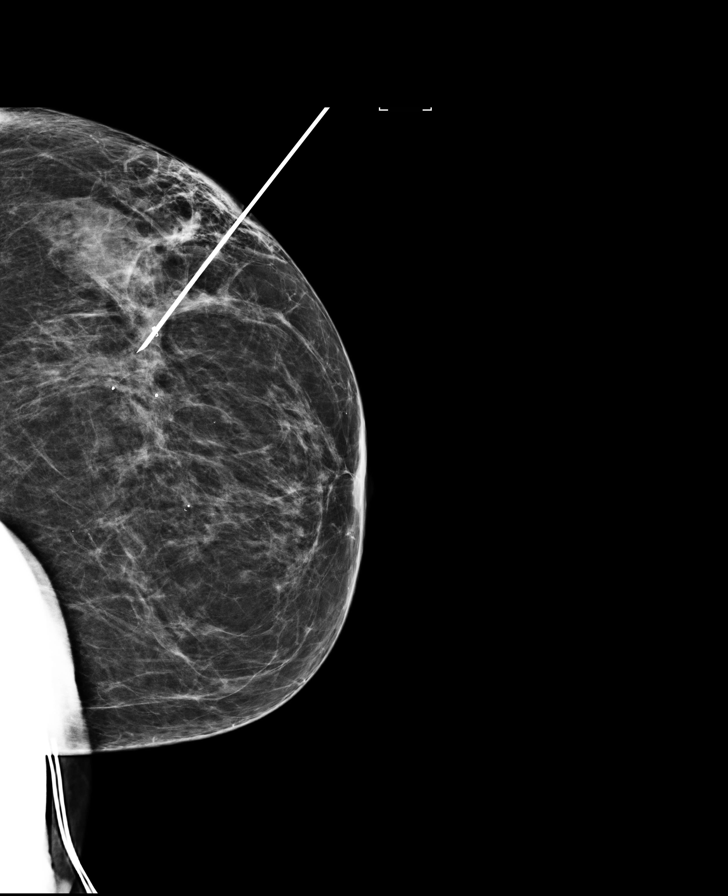

[L LM (3 of 4)]
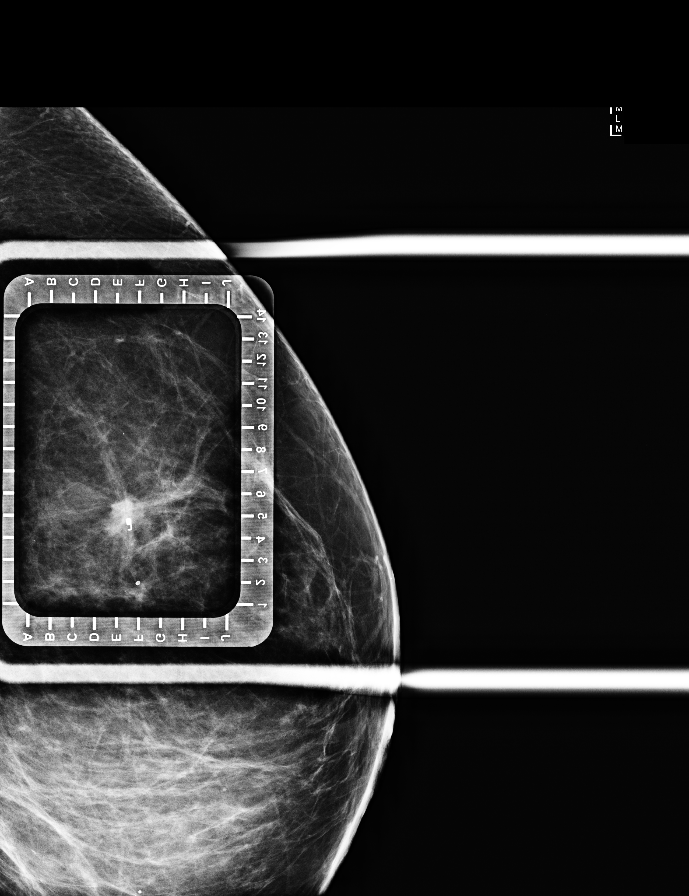

[L LM (4 of 4)]
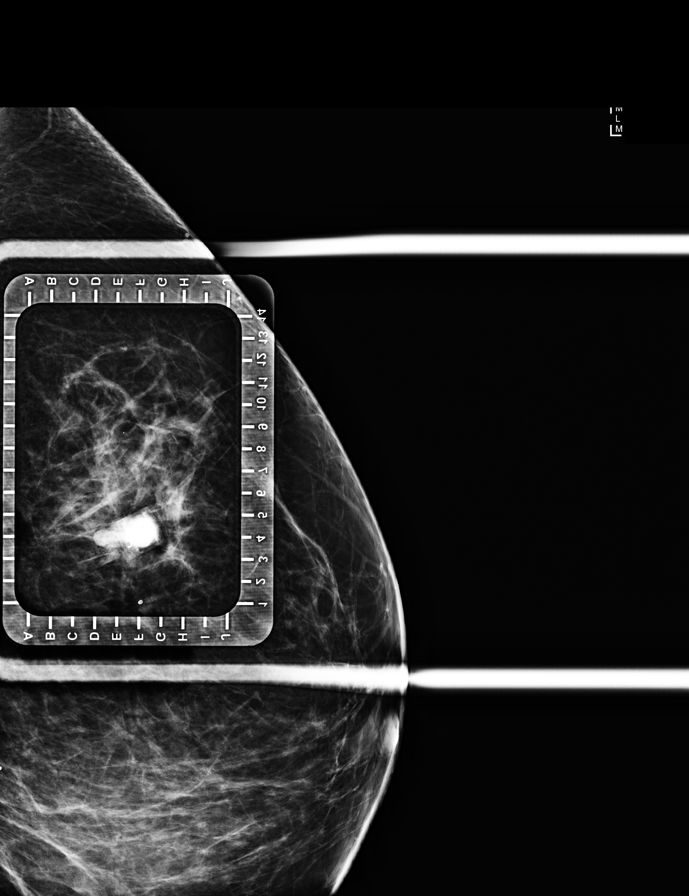

[L CC (3 of 4)]
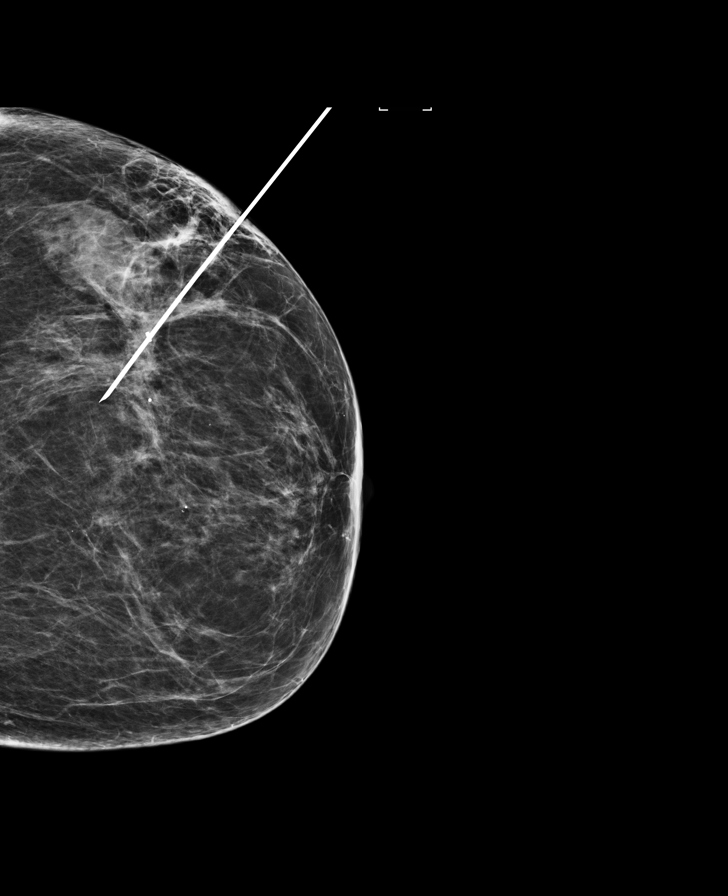

[L CC (4 of 4)]
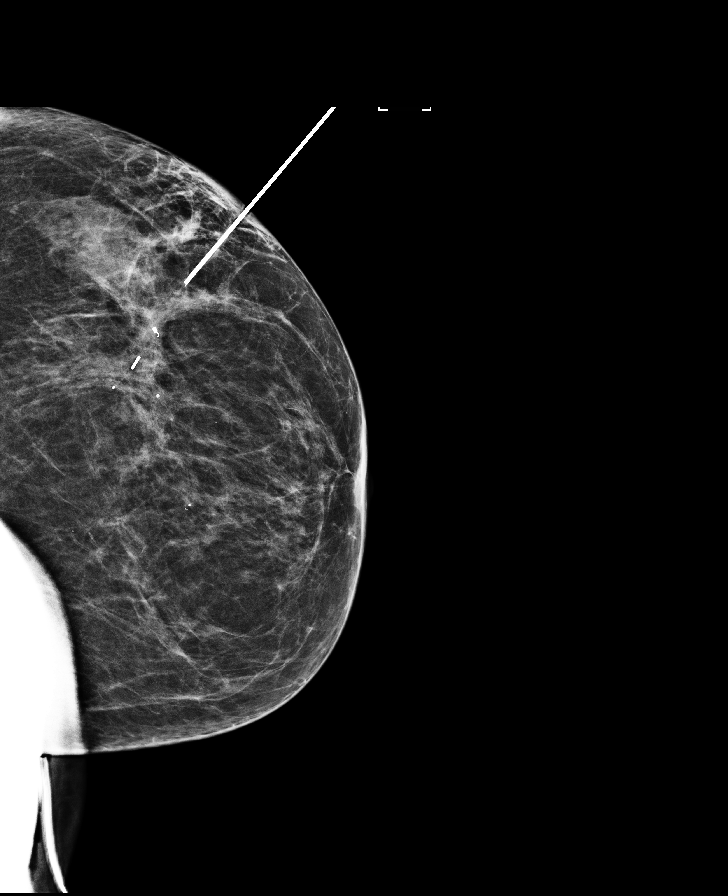

[8 of 20 positions shown; findings below may reference images not displayed]

FINDINGS: Patient presents for radioactive seed localization prior to
lumpectomy. I met with the patient and we discussed the procedure of
seed localization including benefits and alternatives. We discussed
the high likelihood of a successful procedure. We discussed the
risks of the procedure including infection, bleeding, tissue injury
and further surgery. We discussed the low dose of radioactivity
involved in the procedure. Informed, written consent was given.

The usual time-out protocol was performed immediately prior to the
procedure.

Using mammographic guidance, sterile technique, 1% lidocaine and an
D-E82 radioactive seed, the tissue 1.4 centimeters MEDIAL to the
coil shaped clip the UPPER-OUTER QUADRANT of the LEFT breast was
localized using a LATERAL to MEDIAL approach. The follow-up
mammogram images confirm the seed in the expected location and were
marked for Dr. Etoil.

Follow-up survey of the patient confirms presence of the radioactive
seed.

Order number of D-E82 seed:  [id number].

Total activity:  0.248 millicuries reference Date: 10/13/2019

The patient tolerated the procedure well and was released from the
[REDACTED]. She was given instructions regarding seed removal.
IMPRESSION: Radioactive seed localization left breast. No apparent
complications.

ADDENDUM:
Corrected report:
FINDINGS: Patient presents for radioactive seed localization prior to
lumpectomy. I met with the patient and we discussed the procedure of
seed localization including benefits and alternatives. We discussed
the high likelihood of a successful procedure. We discussed the
risks of the procedure including infection, bleeding, tissue injury
and further surgery. We discussed the low dose of radioactivity
involved in the procedure. Informed, written consent was given.

The usual time-out protocol was performed immediately prior to the
procedure.

Using mammographic guidance, sterile technique, 1% lidocaine and an
D-E82 radioactive seed, the tissue 1.4 centimeters MEDIAL to the
coil shaped clip the UPPER-OUTER QUADRANT of the LEFT breast was
localized using a LATERAL to MEDIAL approach. The follow-up
mammogram images confirm the seed in the expected location and were
marked for Dr. Etoil.

Follow-up survey of the patient confirms presence of the radioactive
seed.

Order number of D-E82 seed:  [id number].

Total activity:  0.248 millicuries reference Date: 10/13/2019

The patient tolerated the procedure well and was released from the
[REDACTED]. She was given instructions regarding seed removal.
IMPRESSION: Radioactive seed localization left breast. No apparent
complications.

## 2021-01-30 IMAGING — MG MM BREAST SURGICAL SPECIMEN
1 series · 2 of 2 positions shown · non-contrast
Comparison: Previous exam(s).

CLINICAL DATA: Evaluate specimen

EXAM:
SPECIMEN RADIOGRAPH OF THE LEFT BREAST

[Series 2: L · left · 0.07mm/px · 2 of 2 slices shown]
[im 1/2]
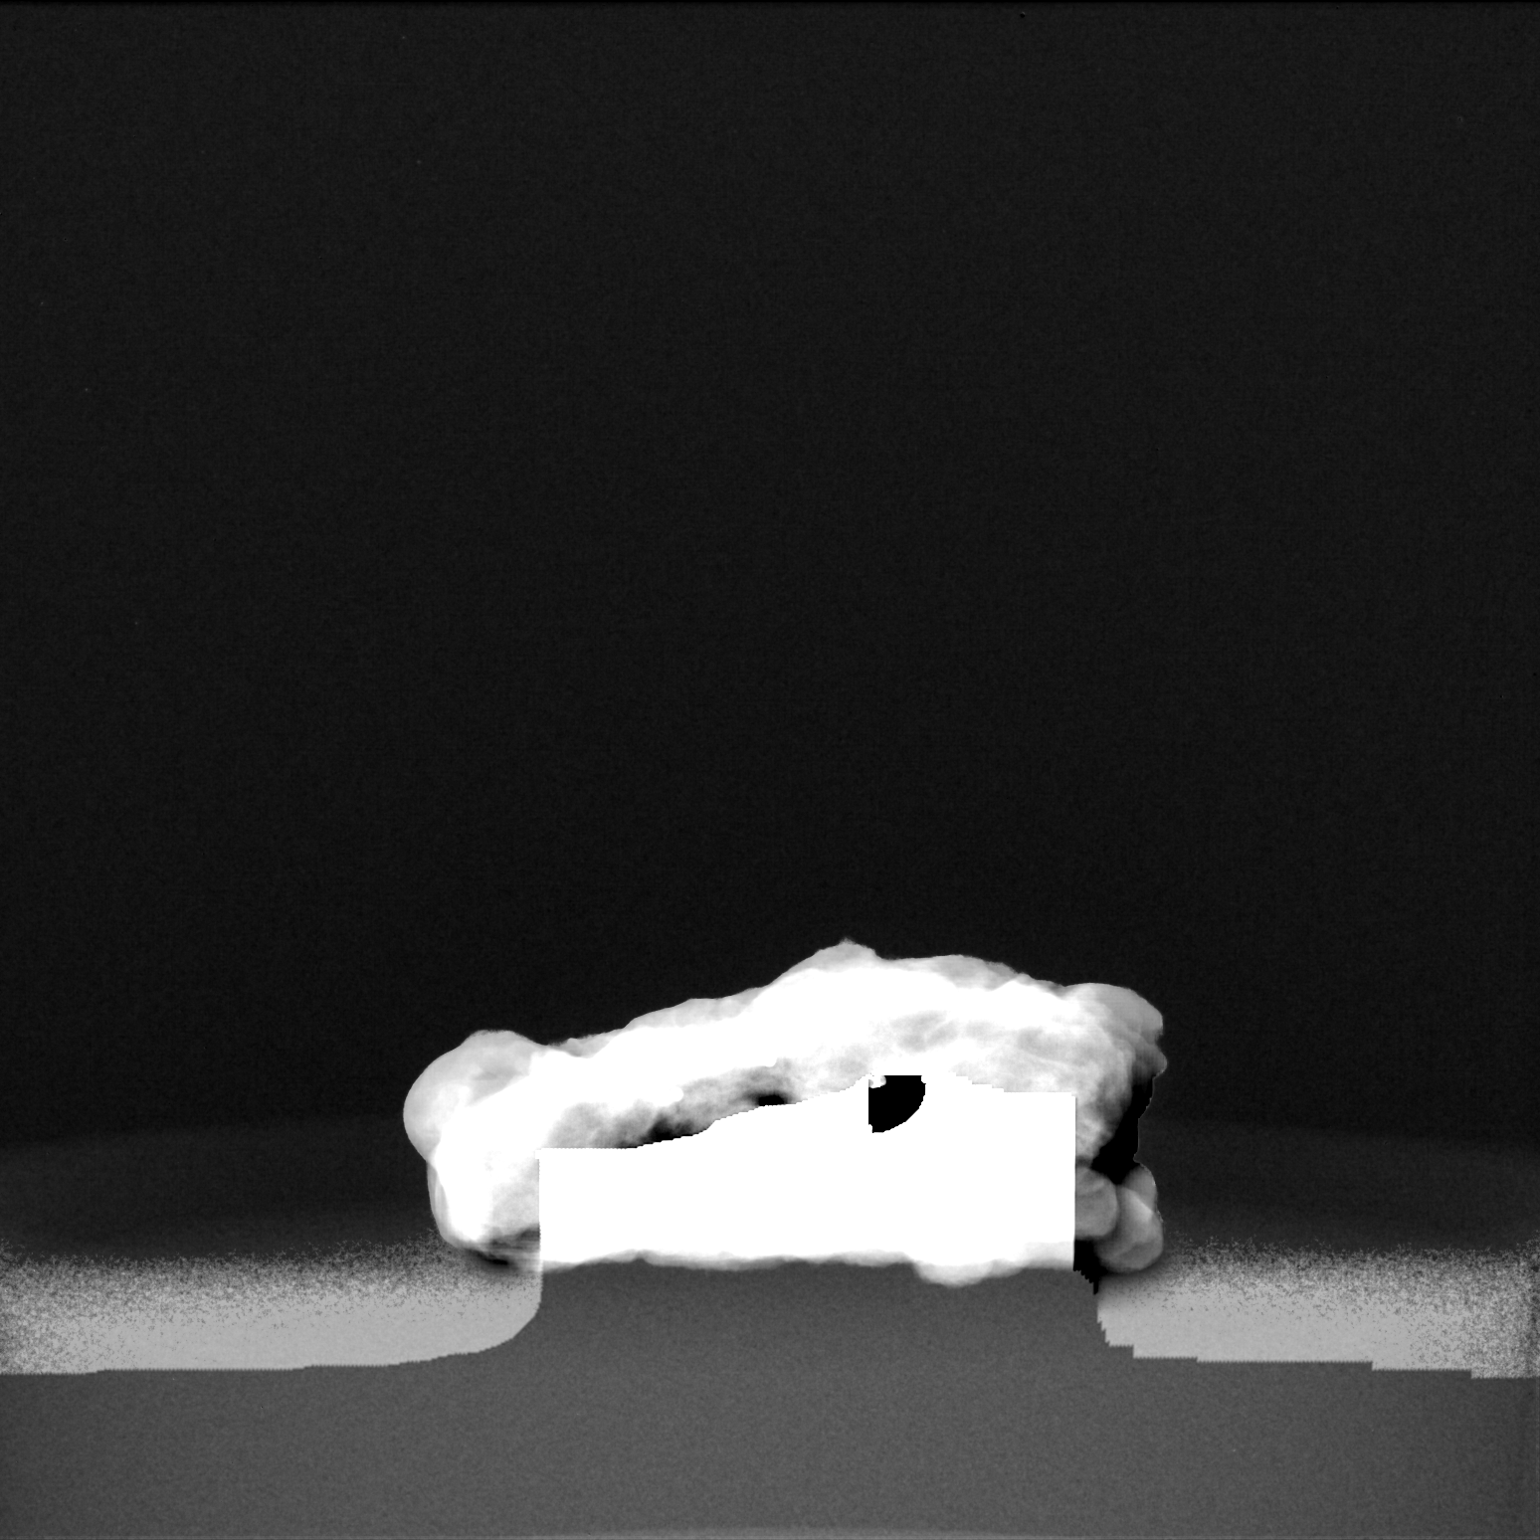
[im 2/2]
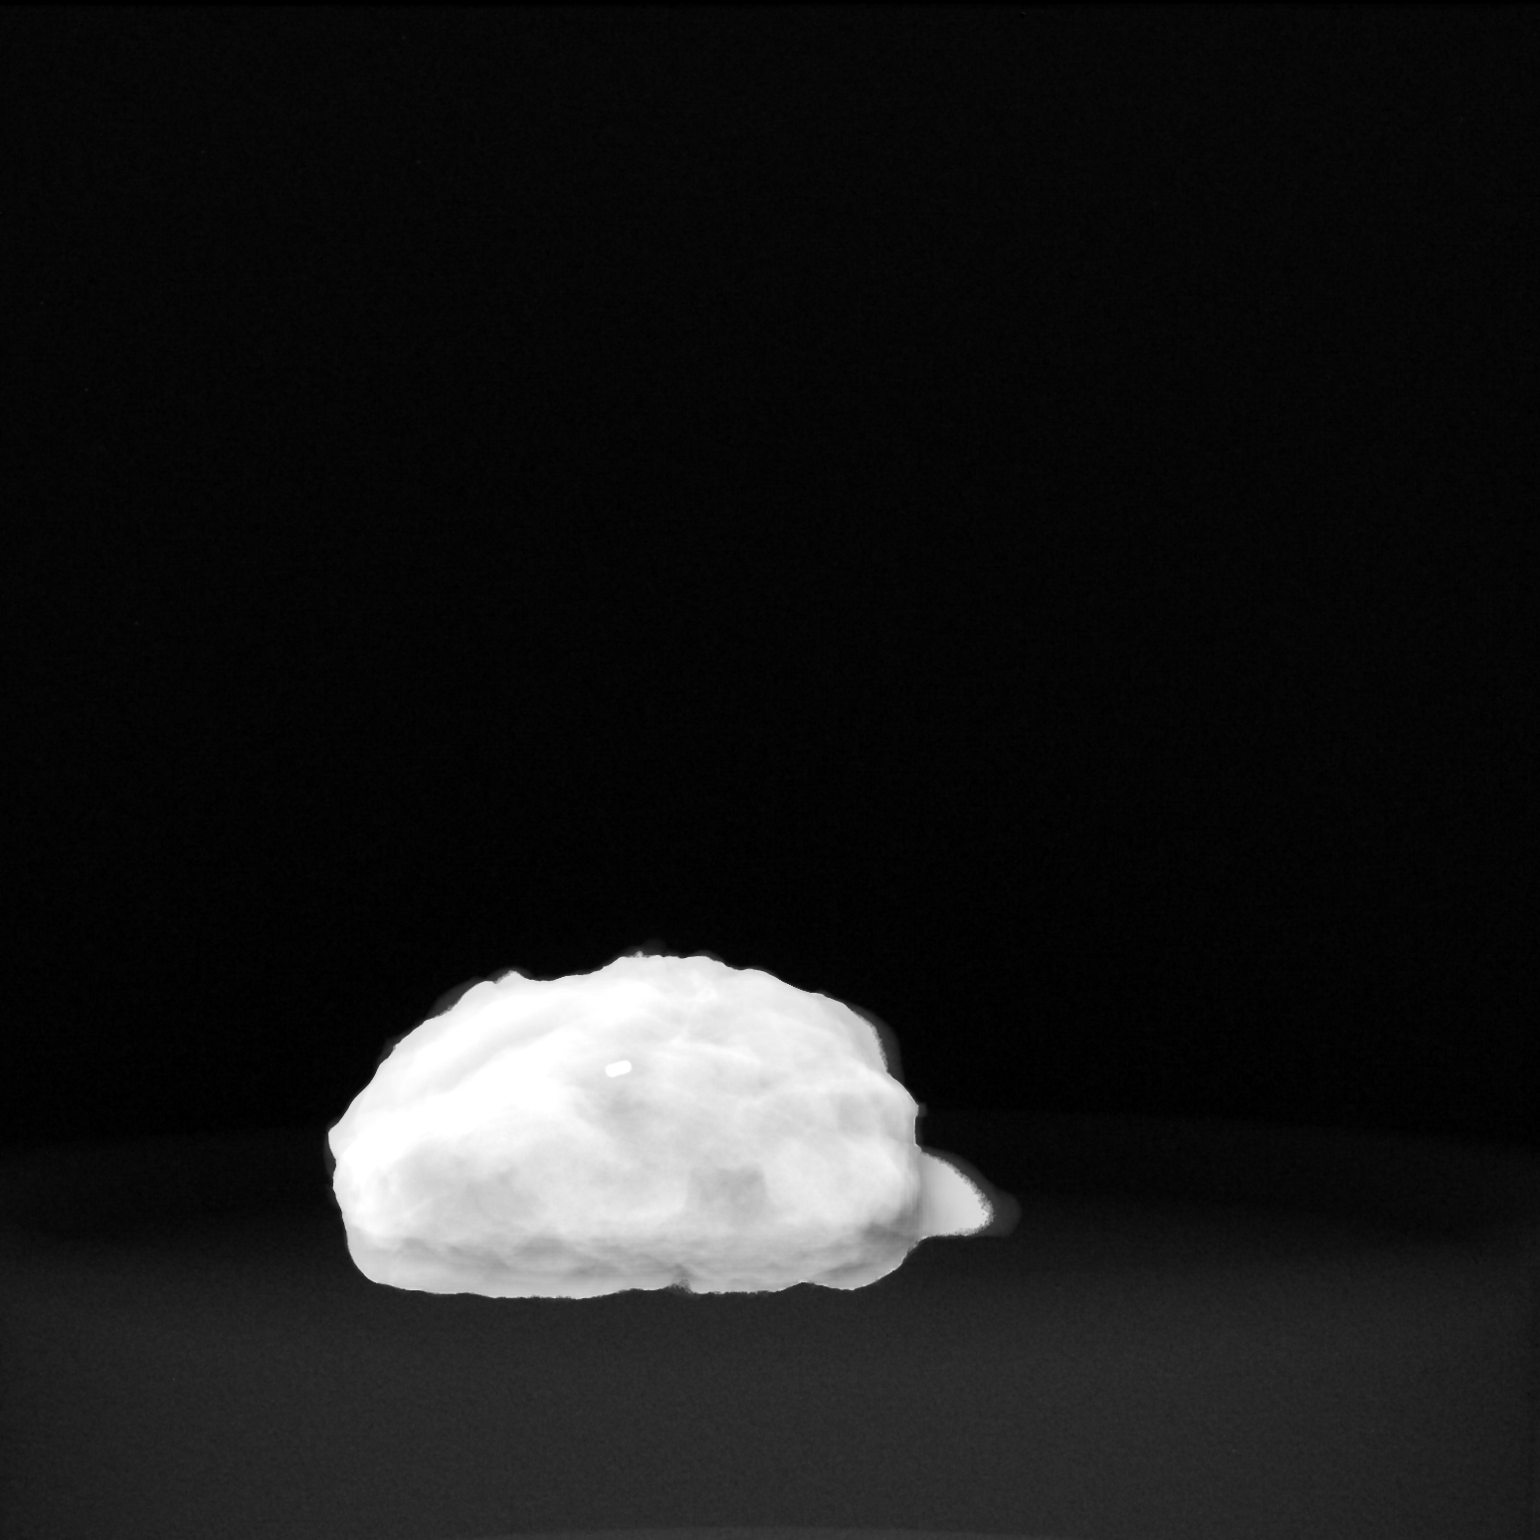

[2 of 2 positions shown; findings below may reference images not displayed]

FINDINGS: Status post excision of the left breast. The radioactive seed and
biopsy marker clip are present, completely intact, and were marked
for pathology.
IMPRESSION: Specimen radiograph of the left breast.

## 2021-05-21 ENCOUNTER — Encounter (HOSPITAL_COMMUNITY): Payer: Self-pay
